# Patient Record
Sex: Female | Born: 2000 | Race: White | Hispanic: No | Marital: Single
Health system: Midwestern US, Community
[De-identification: ages and names within clinical notes are randomized; demographics above are authoritative.]

## PROBLEM LIST (undated history)

## (undated) DIAGNOSIS — F429 Obsessive-compulsive disorder, unspecified: Secondary | ICD-10-CM

## (undated) DIAGNOSIS — R51 Headache: Secondary | ICD-10-CM

## (undated) HISTORY — DX: Headache: R51

---
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
## 2020-09-07 ENCOUNTER — Inpatient Hospital Stay
Admit: 2020-09-07 | Discharge: 2020-09-07 | Disposition: A | Discharge status: Home / Self Care | Payer: PRIVATE HEALTH INSURANCE | Attending: Emergency Medicine

## 2020-09-07 DIAGNOSIS — N390 Urinary tract infection, site not specified: Secondary | ICD-10-CM

## 2020-09-07 LAB — HCG URINE, QL. - POC
HCG, Pregnancy, Urine, POC: NEGATIVE
Pregnancy test,urine (POC): NEGATIVE

## 2020-09-07 LAB — URINE MICROSCOPIC
Casts UA: 0 /lpf
Casts: 0 /lpf
Crystals, UA: 0 /LPF
Crystals, urine: 0 /LPF
Epithelial Cells, UA: 0 /hpf
Epithelial cells: 0 /hpf
MUCUS, URINE: 0 /lpf
Mucus: 0 /lpf

## 2020-09-07 MED ORDER — CEPHALEXIN 500 MG CAP
500 mg | ORAL | Status: AC
Start: 2020-09-07 — End: 2020-09-07
  Administered 2020-09-07: 06:00:00 via ORAL

## 2020-09-07 MED ORDER — PHENAZOPYRIDINE 95 MG TABLET
95 mg | ORAL | Status: AC
Start: 2020-09-07 — End: 2020-09-07
  Administered 2020-09-07: 06:00:00 via ORAL

## 2020-09-07 MED ORDER — CEPHALEXIN 500 MG CAP
500 mg | ORAL_CAPSULE | Freq: Three times a day (TID) | ORAL | 0 refills | Status: AC
Start: 2020-09-07 — End: 2020-09-12

## 2020-09-07 MED ORDER — PHENAZOPYRIDINE 200 MG TAB
200 mg | ORAL_TABLET | Freq: Three times a day (TID) | ORAL | 0 refills | Status: AC
Start: 2020-09-07 — End: 2020-09-09

## 2020-09-07 MED FILL — CEPHALEXIN 500 MG CAP: 500 mg | ORAL | Qty: 1

## 2020-09-07 MED FILL — PHENAZOPYRIDINE 95 MG TABLET: 95 mg | ORAL | Qty: 2

## 2020-09-07 NOTE — ED Notes (Signed)
Pt arrives via POV coming from home c/o UTI like symptoms and back pain that  started at 9pm. Pt states her urine has been orange colored and has had this issue before. Pt states she was planning to go to urgent care in the morning but her back pain has not let her sleep. No hx of kidney stones. No other complaints at time of triage.

## 2020-09-07 NOTE — ED Notes (Signed)

## 2020-09-07 NOTE — ED Provider Notes (Signed)
The history is provided by the patient.   Bladder Infection   This is a new problem. The current episode started 3 to 5 hours ago. The problem occurs every urination. The problem has not changed since onset.The quality of the pain is described as burning and aching. The pain is at a severity of 3/10. There has been no fever. She is sexually active. There is no history of pyelonephritis. Associated symptoms include frequency. Pertinent negatives include no chills, no sweats, no nausea, no vomiting, no discharge, no hematuria, no hesitancy, no possible pregnancy, no urgency and no flank pain. She has tried nothing for the symptoms. Her past medical history is significant for recurrent UTIs. Her past medical history does not include kidney stones, single kidney, urological procedure, urinary stasis or catheterization.        No past medical history on file.    No past surgical history on file.      No family history on file.    Social History     Socioeconomic History   ??? Marital status: SINGLE     Spouse name: Not on file   ??? Number of children: Not on file   ??? Years of education: Not on file   ??? Highest education level: Not on file   Occupational History   ??? Not on file   Tobacco Use   ??? Smoking status: Not on file   Substance and Sexual Activity   ??? Alcohol use: Not on file   ??? Drug use: Not on file   ??? Sexual activity: Not on file   Other Topics Concern   ??? Not on file   Social History Narrative   ??? Not on file     Social Determinants of Health     Financial Resource Strain:    ??? Difficulty of Paying Living Expenses:    Food Insecurity:    ??? Worried About Programme researcher, broadcasting/film/video in the Last Year:    ??? Barista in the Last Year:    Transportation Needs:    ??? Freight forwarder (Medical):    ??? Lack of Transportation (Non-Medical):    Physical Activity:    ??? Days of Exercise per Week:    ??? Minutes of Exercise per Session:    Stress:    ??? Feeling of Stress :    Social Connections:    ??? Frequency of Communication  with Friends and Family:    ??? Frequency of Social Gatherings with Friends and Family:    ??? Attends Religious Services:    ??? Database administrator or Organizations:    ??? Attends Engineer, structural:    ??? Marital Status:    Intimate Programme researcher, broadcasting/film/video Violence:    ??? Fear of Current or Ex-Partner:    ??? Emotionally Abused:    ??? Physically Abused:    ??? Sexually Abused:          ALLERGIES: Patient has no known allergies.    Review of Systems   Constitutional: Negative for chills and fever.   HENT: Negative for congestion.    Gastrointestinal: Negative for abdominal pain, nausea and vomiting.   Genitourinary: Positive for dysuria and frequency. Negative for flank pain, hematuria, hesitancy, urgency, vaginal bleeding and vaginal discharge.   Musculoskeletal: Positive for back pain.   All other systems reviewed and are negative.      Vitals:    09/07/20 0028   BP: 131/84   Pulse: 79  Resp: 16   Temp: 99.1 ??F (37.3 ??C)   SpO2: 100%   Weight: 68 kg (150 lb)   Height: 5\' 10"  (1.778 m)            Physical Exam  Vitals and nursing note reviewed.   Constitutional:       General: She is not in acute distress.     Appearance: She is well-developed.   HENT:      Head: Normocephalic and atraumatic.      Right Ear: External ear normal.      Left Ear: External ear normal.   Eyes:      Conjunctiva/sclera: Conjunctivae normal.      Pupils: Pupils are equal, round, and reactive to light.   Cardiovascular:      Rate and Rhythm: Normal rate and regular rhythm.      Heart sounds: Normal heart sounds. No murmur heard.     Pulmonary:      Effort: Pulmonary effort is normal.      Breath sounds: Normal breath sounds.   Abdominal:      General: Bowel sounds are normal.      Palpations: Abdomen is soft.      Tenderness: There is no abdominal tenderness. There is no right CVA tenderness or left CVA tenderness.   Musculoskeletal:         General: Normal range of motion.      Cervical back: Normal range of motion and neck supple.   Skin:     General:  Skin is warm and dry.      Capillary Refill: Capillary refill takes less than 2 seconds.   Neurological:      General: No focal deficit present.      Mental Status: She is alert and oriented to person, place, and time.   Psychiatric:         Mood and Affect: Mood normal.         Behavior: Behavior normal.          MDM  Number of Diagnoses or Management Options  Urinary tract infection without hematuria, site unspecified: new and requires workup     Amount and/or Complexity of Data Reviewed  Clinical lab tests: ordered and reviewed  Review and summarize past medical records: yes    Risk of Complications, Morbidity, and/or Mortality  Presenting problems: low  Diagnostic procedures: minimal  Management options: low    Patient Progress  Patient progress: improved         Procedures      The patient was observed in the ED. patient feeling much improved at time of discharge.  With the acute onset of discomfort starting at 9 PM, concern for possible kidney stone exists.  At this time we will treat the patient for urinary tract infection, instructed to take Motrin as needed for pain, and Pyridium as needed for dysuria.    Results Reviewed:      Recent Results (from the past 24 hour(s))   HCG URINE, QL. - POC    Collection Time: 09/07/20 12:55 AM   Result Value Ref Range    Pregnancy test,urine (POC) Negative NEG     URINE MICROSCOPIC    Collection Time: 09/07/20 12:58 AM   Result Value Ref Range    WBC 5-10 0 /hpf    RBC 3-5 0 /hpf    Epithelial cells 0 0 /hpf    Bacteria TRACE 0 /hpf    Casts 0 0 /lpf  Crystals, urine 0 0 /LPF    Mucus 0 0 /lpf    Other observations RESULTS VERIFIED MANUALLY         I discussed the results of all labs, procedures, radiographs, and treatments with the patient and available family.  Treatment plan is agreed upon and the patient is ready for discharge.  All voiced understanding of the discharge plan and medication instructions or changes as appropriate.  Questions about treatment in the ED  were answered.  All were encouraged to return should symptoms worsen or new problems develop.

## 2020-09-12 LAB — CHLAMYDIA / GC-AMPLIFIED
CHLAMYDIA TRACHOMATIS, NAA, 188078: NEGATIVE
Chlamydia trachomatis, NAA: NEGATIVE
NEISSERIA GONORRHOEAE, NAA, 188086: NEGATIVE
Neisseria gonorrhoeae, NAA: NEGATIVE

## 2020-10-09 DIAGNOSIS — Z3041 Encounter for surveillance of contraceptive pills: Secondary | ICD-10-CM | POA: Diagnosis not present

## 2020-10-09 DIAGNOSIS — F5002 Anorexia nervosa, binge eating/purging type: Secondary | ICD-10-CM | POA: Diagnosis not present

## 2020-10-09 DIAGNOSIS — F411 Generalized anxiety disorder: Secondary | ICD-10-CM | POA: Diagnosis not present

## 2020-10-09 DIAGNOSIS — F429 Obsessive-compulsive disorder, unspecified: Secondary | ICD-10-CM | POA: Diagnosis not present

## 2020-10-30 DIAGNOSIS — L700 Acne vulgaris: Secondary | ICD-10-CM | POA: Diagnosis not present

## 2020-10-30 DIAGNOSIS — D229 Melanocytic nevi, unspecified: Secondary | ICD-10-CM | POA: Diagnosis not present

## 2020-10-30 DIAGNOSIS — D1801 Hemangioma of skin and subcutaneous tissue: Secondary | ICD-10-CM | POA: Diagnosis not present

## 2020-11-12 DIAGNOSIS — Z0000 Encounter for general adult medical examination without abnormal findings: Secondary | ICD-10-CM | POA: Diagnosis not present

## 2021-03-08 ENCOUNTER — Other Ambulatory Visit (HOSPITAL_BASED_OUTPATIENT_CLINIC_OR_DEPARTMENT_OTHER): Payer: Self-pay

## 2021-03-08 ENCOUNTER — Other Ambulatory Visit: Payer: Self-pay

## 2021-03-08 ENCOUNTER — Ambulatory Visit: Payer: Self-pay | Attending: Internal Medicine

## 2021-03-08 DIAGNOSIS — Z23 Encounter for immunization: Secondary | ICD-10-CM

## 2021-03-08 MED ORDER — PFIZER-BIONT COVID-19 VAC-TRIS 30 MCG/0.3ML IM SUSP
INTRAMUSCULAR | 0 refills | Status: AC
  Filled 2021-03-08: qty 0.3, 1d supply, fill #0

## 2021-05-02 ENCOUNTER — Other Ambulatory Visit: Payer: Self-pay | Admitting: Obstetrics and Gynecology

## 2021-05-02 DIAGNOSIS — N6310 Unspecified lump in the right breast, unspecified quadrant: Secondary | ICD-10-CM

## 2021-05-17 ENCOUNTER — Other Ambulatory Visit: Payer: Self-pay | Admitting: Obstetrics and Gynecology

## 2021-05-17 ENCOUNTER — Ambulatory Visit
Admission: RE | Admit: 2021-05-17 | Discharge: 2021-05-17 | Disposition: A | Discharge status: Home / Self Care | Payer: 59 | Source: Ambulatory Visit | Attending: Obstetrics and Gynecology | Admitting: Obstetrics and Gynecology

## 2021-05-17 ENCOUNTER — Other Ambulatory Visit: Payer: Self-pay

## 2021-05-17 DIAGNOSIS — N6310 Unspecified lump in the right breast, unspecified quadrant: Secondary | ICD-10-CM

## 2021-07-25 ENCOUNTER — Other Ambulatory Visit: Payer: Self-pay | Admitting: General Surgery

## 2021-07-25 DIAGNOSIS — Z09 Encounter for follow-up examination after completed treatment for conditions other than malignant neoplasm: Secondary | ICD-10-CM

## 2021-10-28 ENCOUNTER — Ambulatory Visit
Admission: RE | Admit: 2021-10-28 | Discharge: 2021-10-28 | Disposition: A | Discharge status: Home / Self Care | Payer: BLUE CROSS/BLUE SHIELD | Source: Ambulatory Visit | Attending: General Surgery | Admitting: General Surgery

## 2021-10-28 DIAGNOSIS — N6311 Unspecified lump in the right breast, upper outer quadrant: Secondary | ICD-10-CM | POA: Diagnosis not present

## 2021-10-28 DIAGNOSIS — Z09 Encounter for follow-up examination after completed treatment for conditions other than malignant neoplasm: Secondary | ICD-10-CM

## 2021-12-23 DIAGNOSIS — G4700 Insomnia, unspecified: Secondary | ICD-10-CM | POA: Diagnosis not present

## 2021-12-23 DIAGNOSIS — F411 Generalized anxiety disorder: Secondary | ICD-10-CM | POA: Diagnosis not present

## 2021-12-23 DIAGNOSIS — F429 Obsessive-compulsive disorder, unspecified: Secondary | ICD-10-CM | POA: Diagnosis not present

## 2022-02-23 IMAGING — US US  BREAST BX W/ LOC DEV 1ST LESION IMG BX SPEC US GUIDE*R*
1 series · 7 of 7 positions shown · non-contrast
Comparison: Previous exam(s).
COMPARISON: Previous exam(s).

Addendum:
CLINICAL DATA: 20-year-old female with an indeterminate palpable
right breast lump.

EXAM:
ULTRASOUND GUIDED RIGHT BREAST CORE NEEDLE BIOPSY

[Series 1: us breast bx w/ loc dev 1st lesion img bx spec us  · 0.06mm/px · 7 of 7 slices shown]
[im 1/7]
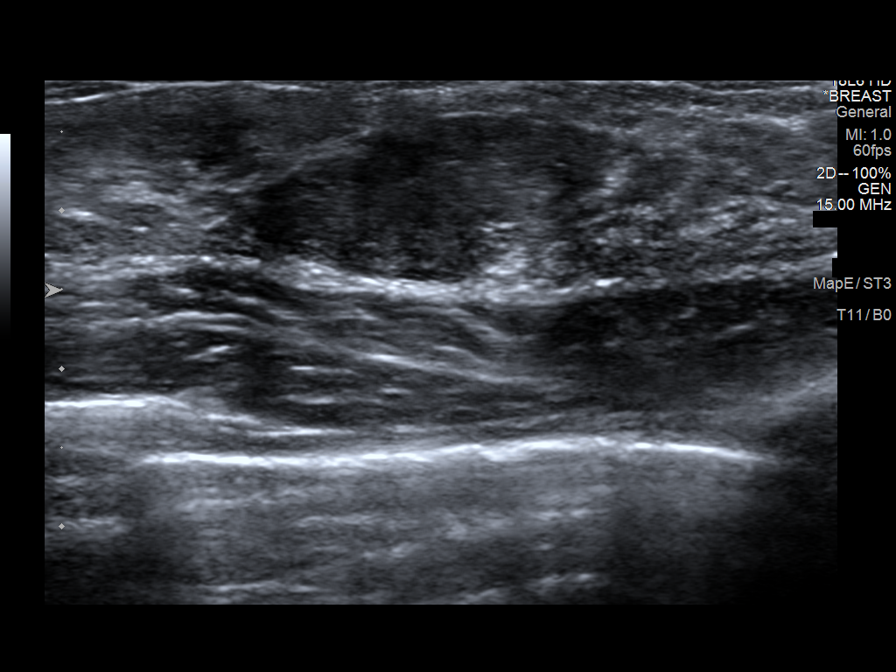
[im 2/7]
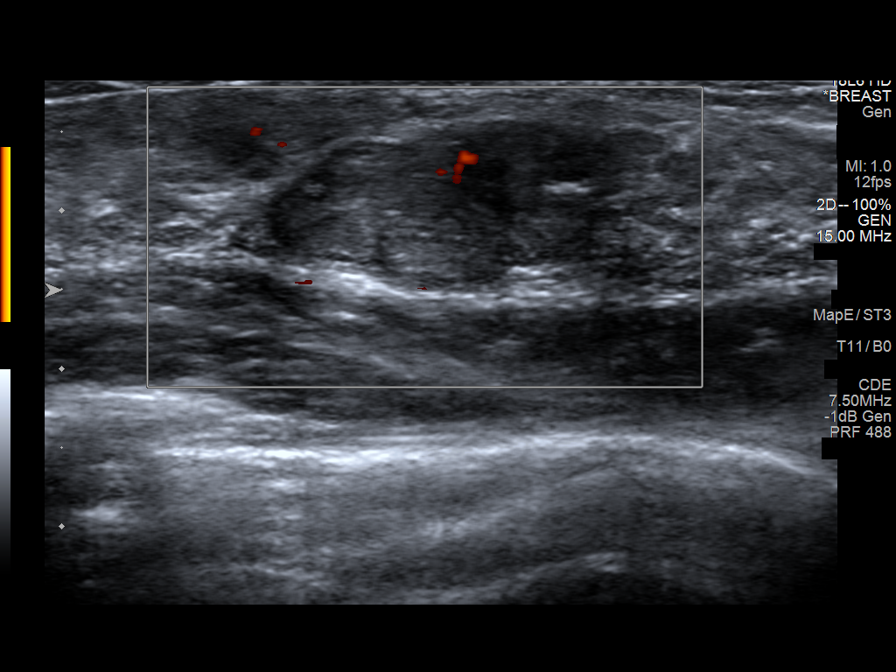
[im 3/7]
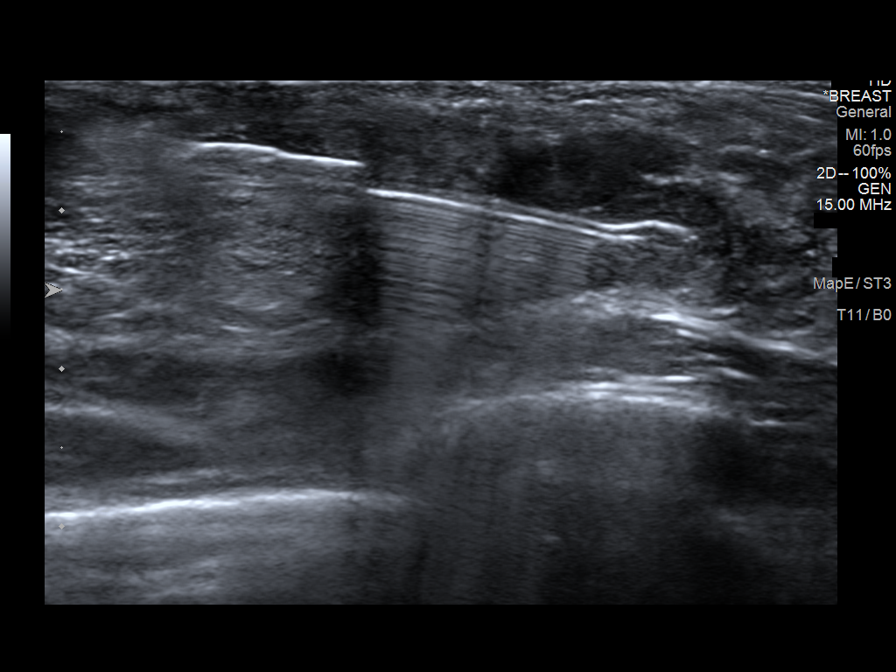
[im 4/7]
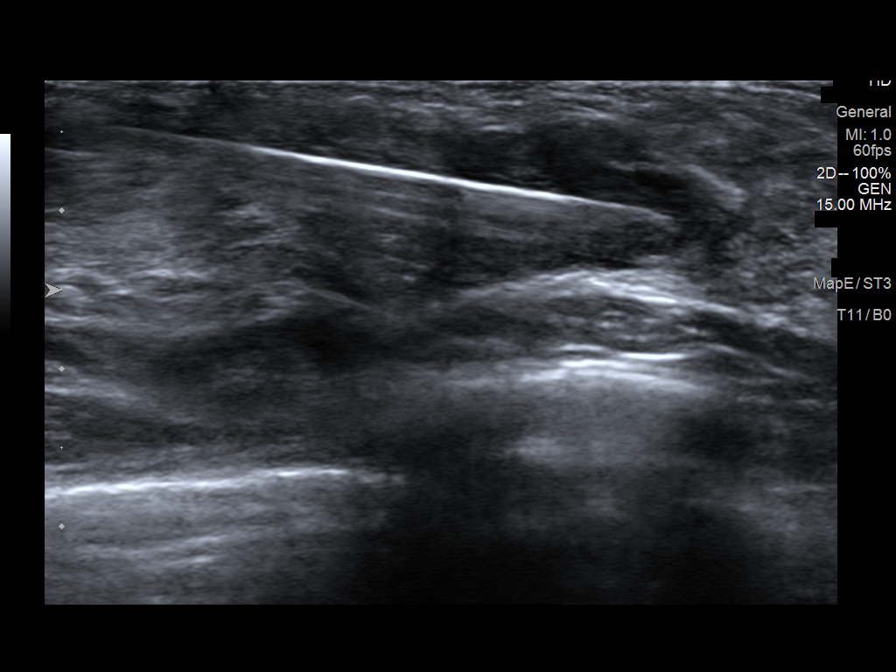
[im 5/7]
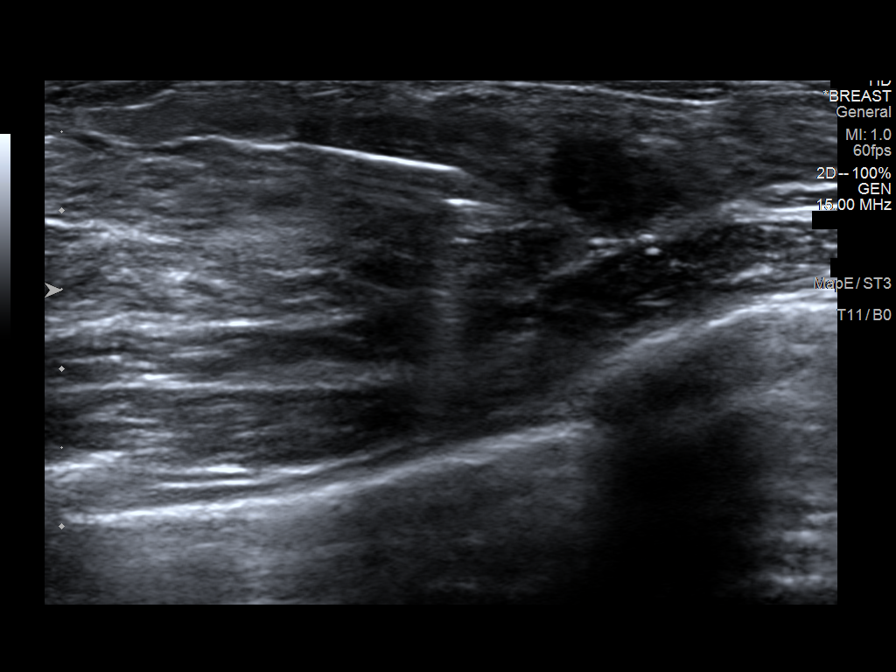
[im 6/7]
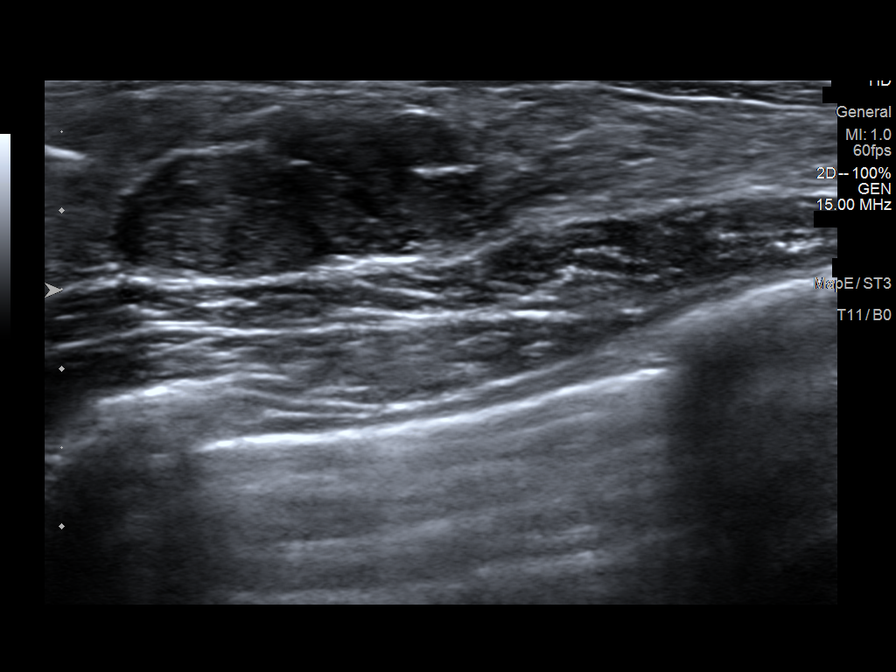
[im 7/7]
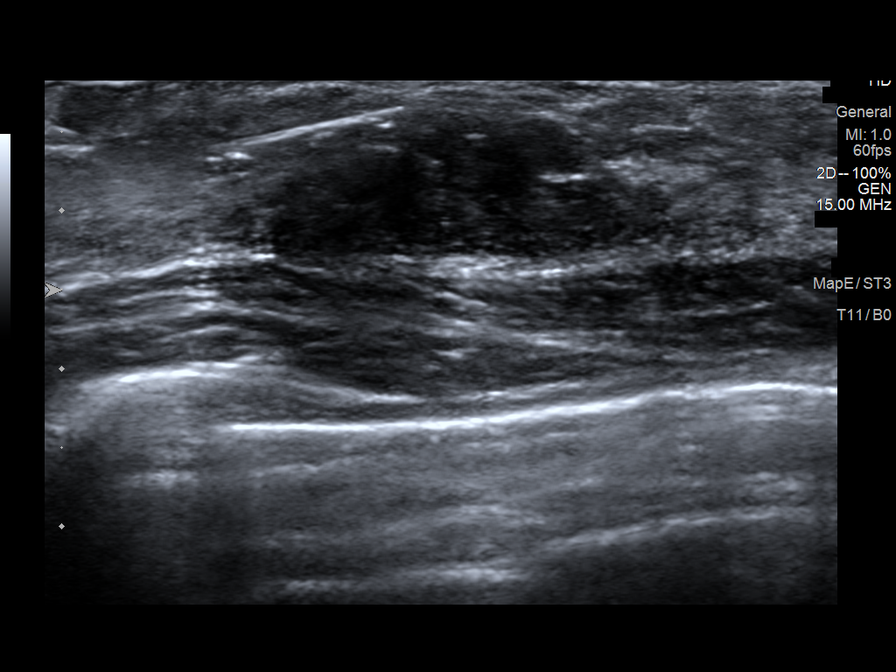

[7 of 7 positions shown; findings below may reference images not displayed]



Lesion quadrant: Upper outer quadrant

Using sterile technique and 1% Lidocaine as local anesthetic, under
direct ultrasound visualization, a 14 gauge Jim device was
used to perform biopsy of a palpable lump at the 11 o'clock position
using a lateral approach. At the conclusion of the procedure a
tribell tissue marker clip was deployed into the biopsy cavity.
Follow up 2 view mammogram was deferred at this time.
IMPRESSION: Ultrasound guided biopsy of the right breast. No apparent
complications.

ADDENDUM:
Pathology revealed FIBROADENOMA of the Right breast, 11:00 o'clock 4
cmfn. This was found to be concordant by Dr. Ingel Otey.

Pathology results were discussed with the patient's mother, Alino
Pepe, by telephone. The patient's mother reported her daughter
did well after the biopsy with tenderness at the site. Post biopsy
instructions and care were reviewed and questions were answered. The
patient's mother was encouraged to call The [REDACTED] of
number was provided.

The patient's mother requested a surgical referral with Dr. Antonio
Tiger at [REDACTED] to discuss the biopsy
findings.

Request for appointment sent to [REDACTED] via [REDACTED]
message on May 20, 2021. The patient will be contacted with the
details regarding this appointment.

Pathology results reported by Peterson Morel, RN on 05/21/2021.



Lesion quadrant: Upper outer quadrant

Using sterile technique and 1% Lidocaine as local anesthetic, under
direct ultrasound visualization, a 14 gauge Jim device was
used to perform biopsy of a palpable lump at the 11 o'clock position
using a lateral approach. At the conclusion of the procedure a
tribell tissue marker clip was deployed into the biopsy cavity.
Follow up 2 view mammogram was deferred at this time.
IMPRESSION: Ultrasound guided biopsy of the right breast. No apparent
complications.

## 2022-02-23 IMAGING — US US BREAST*R* LIMITED INC AXILLA
1 series · 7 of 7 positions shown · non-contrast
Comparison: None.

CLINICAL DATA: 20-year-old female complaining of a palpable
abnormality in the 11 o'clock region of the right breast. Patient's
mother has a history of premenopausal breast cancer.

EXAM:
ULTRASOUND OF THE RIGHT BREAST

[Series 1: us breast*right* limited inc axilla · 0.05mm/px · 7 of 7 slices shown]
[im 1/7]
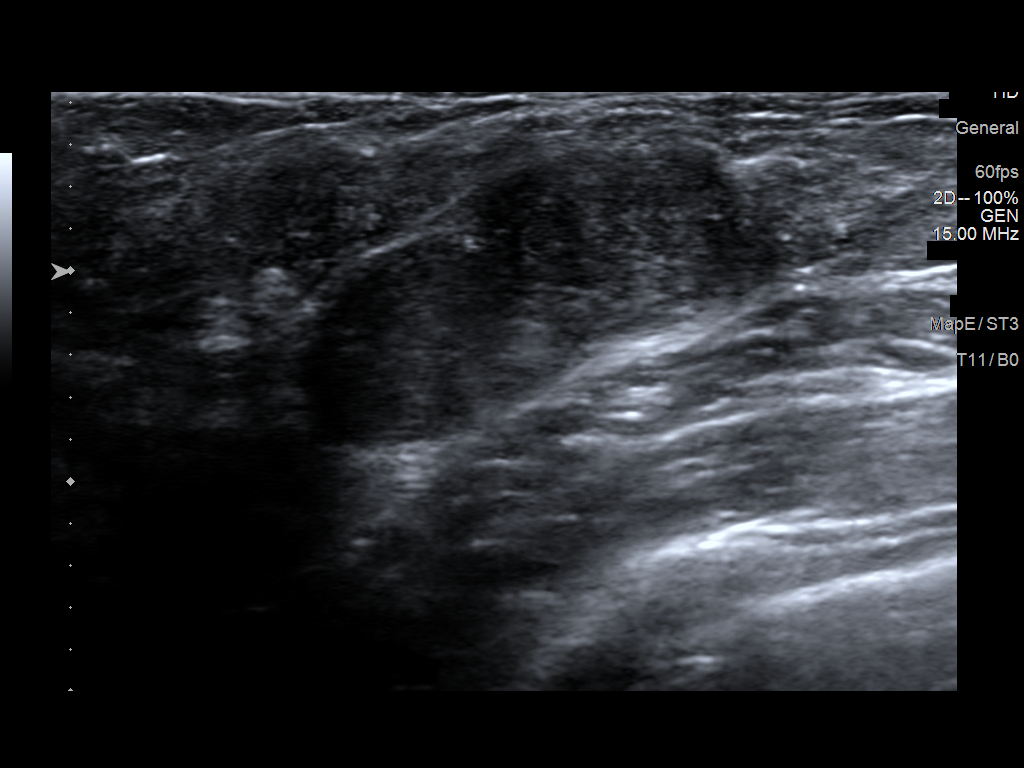
[im 2/7]
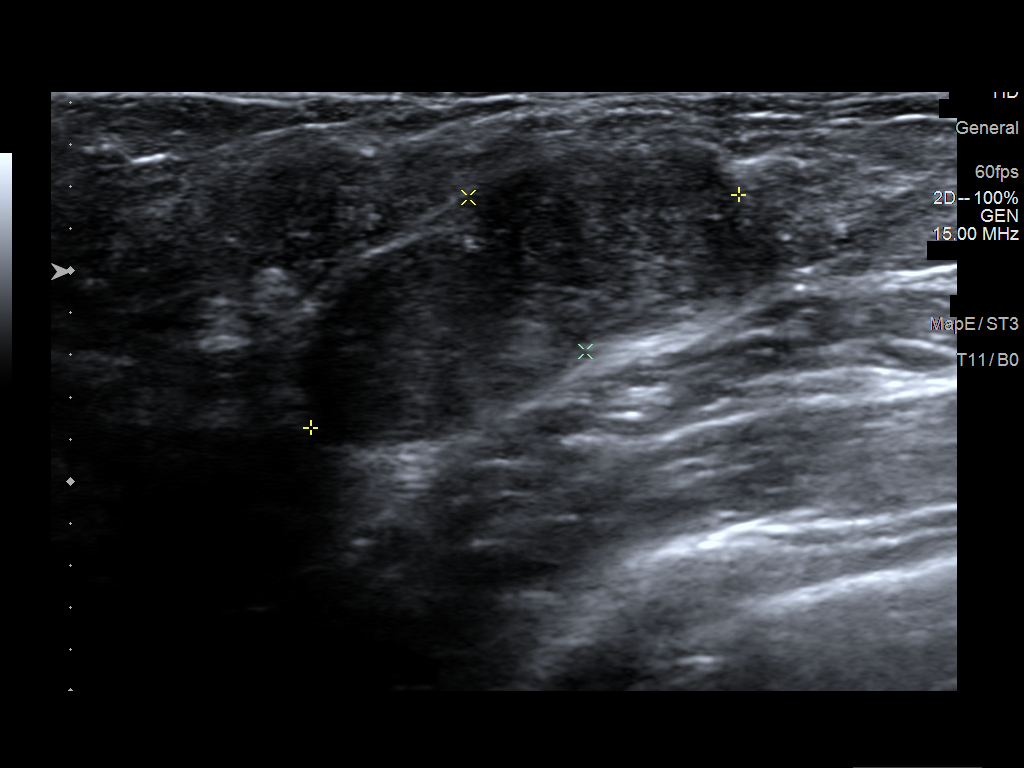
[im 3/7]
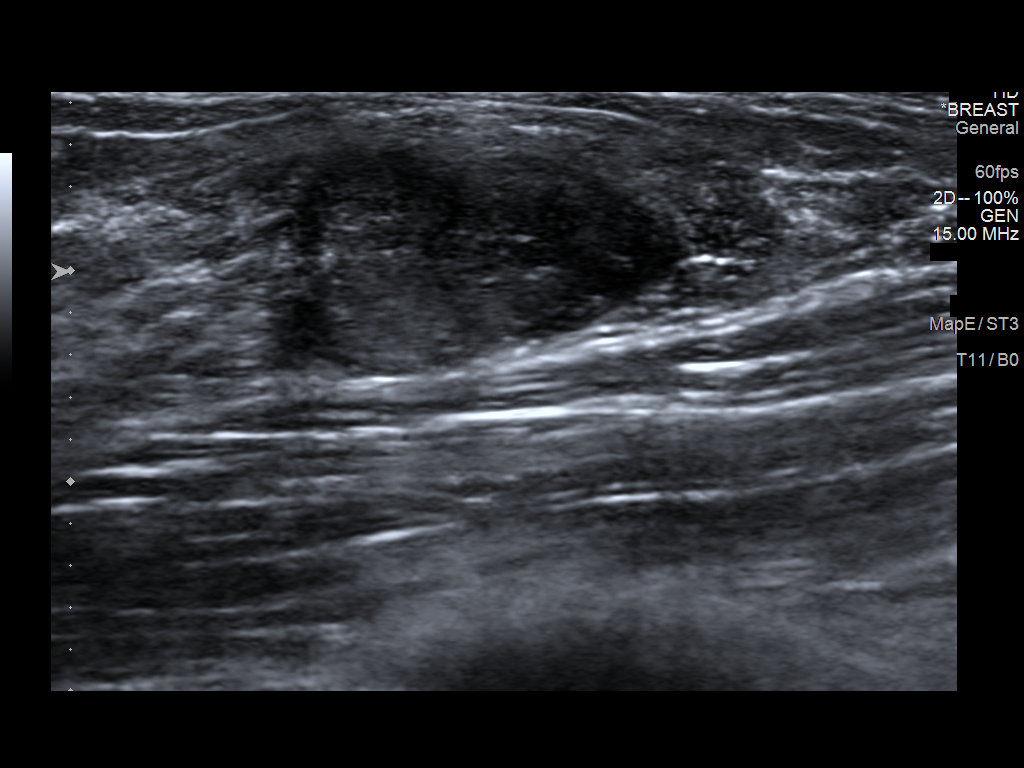
[im 4/7]
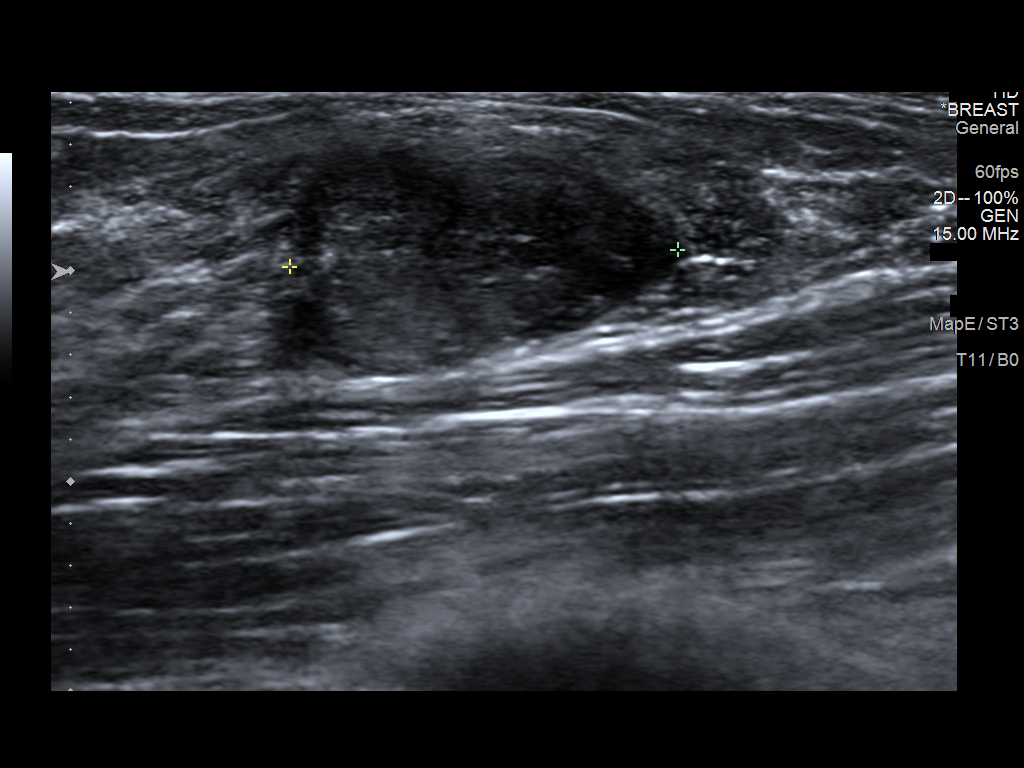
[im 5/7]
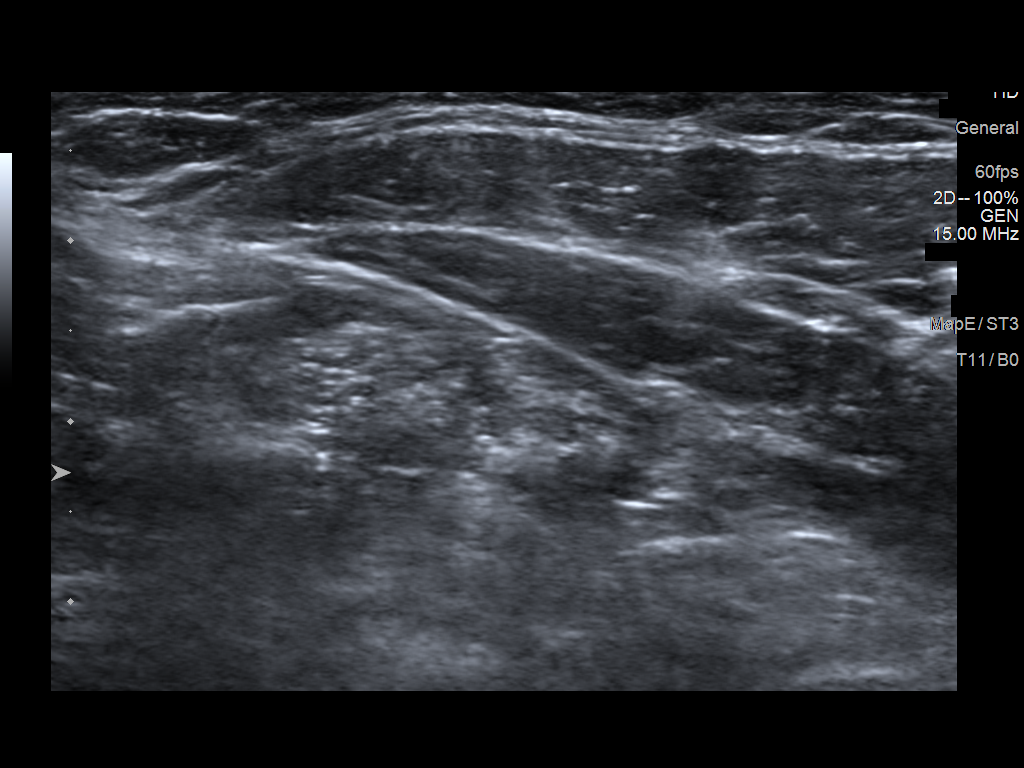
[im 6/7]
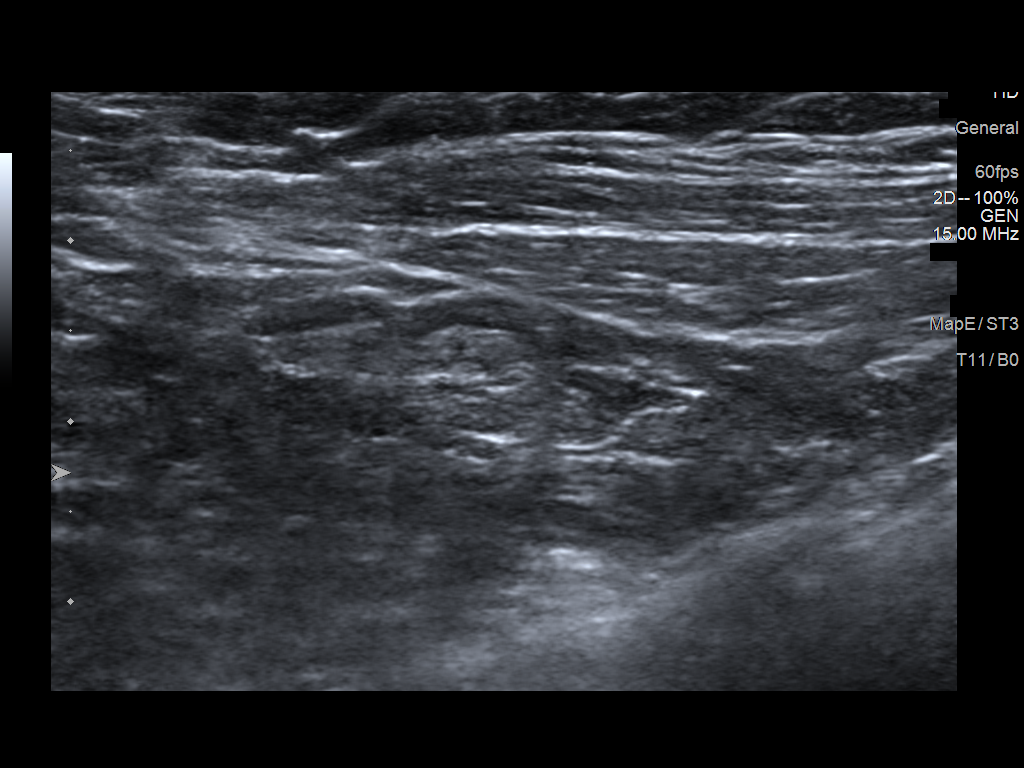
[im 7/7]
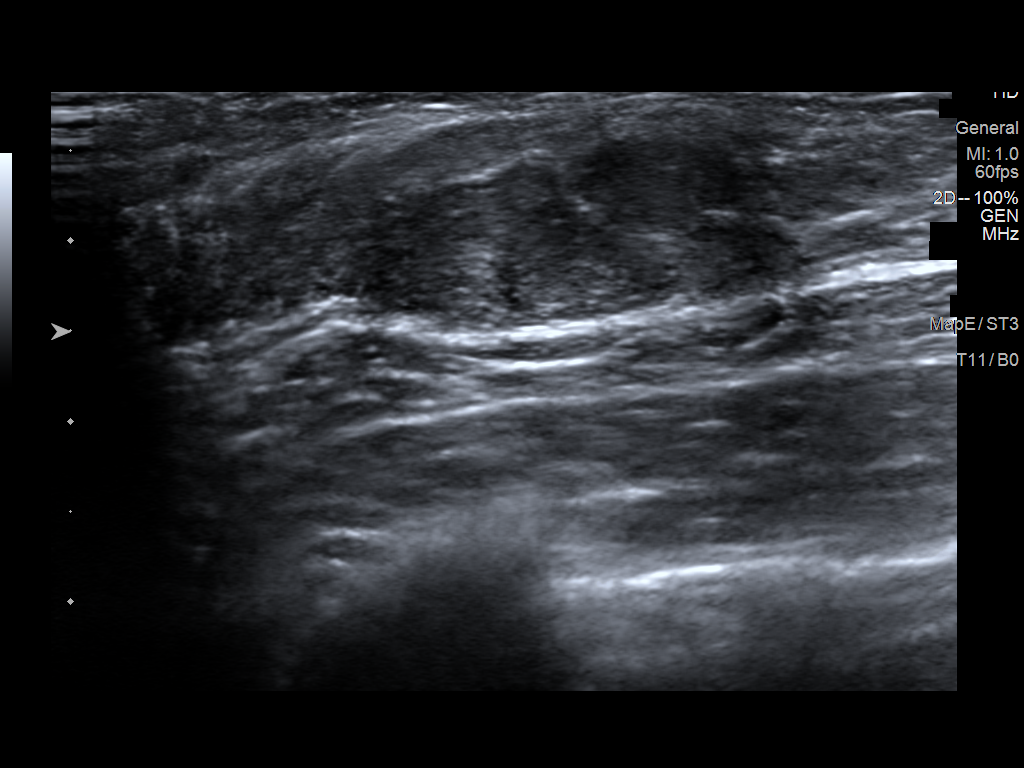

[7 of 7 positions shown; findings below may reference images not displayed]

FINDINGS: On physical exam, I palpate a discrete freely mobile mass in the
right breast 11 o'clock 4 cm from the nipple.

Targeted ultrasound is performed, showing a well-circumscribed
bilobed hypoechoic mass in the right breast 11 o'clock 4 cm from the
nipple measuring 2.3 x 0.9 x 1.8 cm. Sonographic evaluation the
right axilla does not show any enlarged adenopathy.
IMPRESSION: Indeterminate, new palpable right breast mass.

RECOMMENDATION:
Ultrasound-guided core biopsy of mass in the 11 o'clock region of
the right breast is recommended.

I have discussed the findings and recommendations with the patient.
If applicable, a reminder letter will be sent to the patient
regarding the next appointment.

BI-RADS CATEGORY  4: Suspicious.

## 2022-06-17 DIAGNOSIS — Z01419 Encounter for gynecological examination (general) (routine) without abnormal findings: Secondary | ICD-10-CM | POA: Diagnosis not present

## 2022-06-17 DIAGNOSIS — H5201 Hypermetropia, right eye: Secondary | ICD-10-CM | POA: Diagnosis not present

## 2022-06-17 DIAGNOSIS — Z130 Encounter for screening for diseases of the blood and blood-forming organs and certain disorders involving the immune mechanism: Secondary | ICD-10-CM | POA: Diagnosis not present

## 2022-08-06 IMAGING — US US BREAST*R* LIMITED INC AXILLA
1 series · 4 of 4 positions shown · non-contrast
Comparison: 05/17/2021.

CLINICAL DATA: Follow-up for a right breast mass, which presented
as a palpable lump in the 11 o'clock position of the right breast.
This was biopsied with pathology revealing a fibroadenoma,
benign/concordant. Due to a family history breast carcinoma, the
patient saw Dr. Pingo for possible excision. Short-term
follow-up was recommended as an alternative.

EXAM:
ULTRASOUND OF THE RIGHT BREAST

[Series 1: us breast*right* limited inc axilla · 0.06mm/px · 4 of 4 slices shown]
[im 1/4]
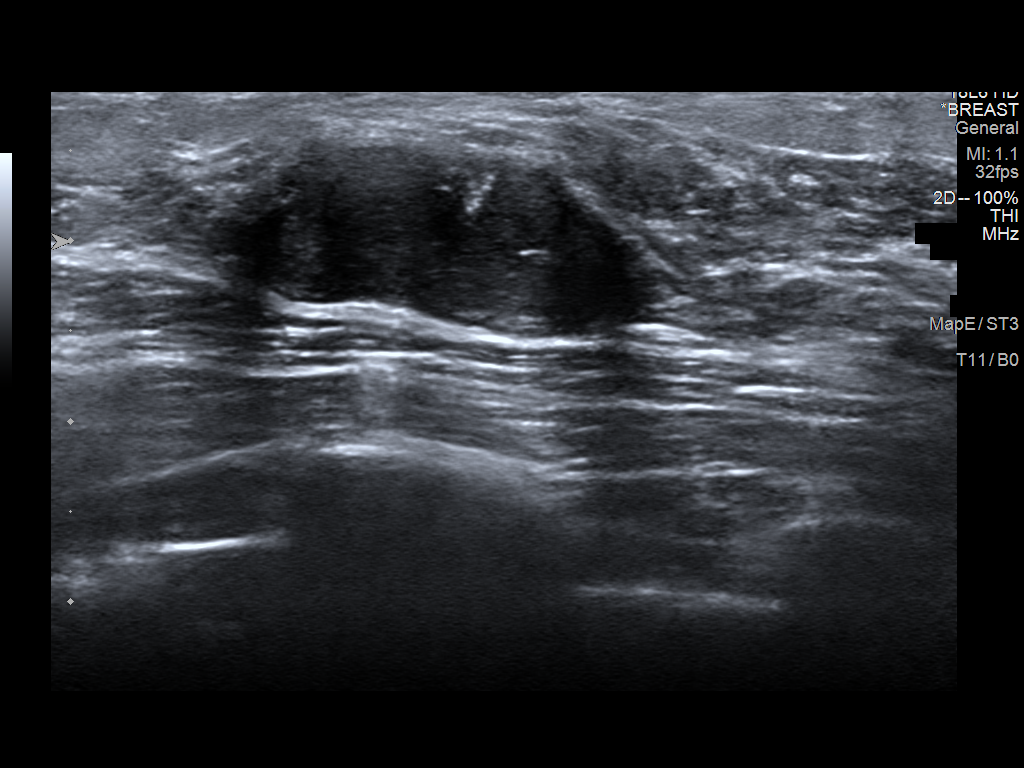
[im 2/4]
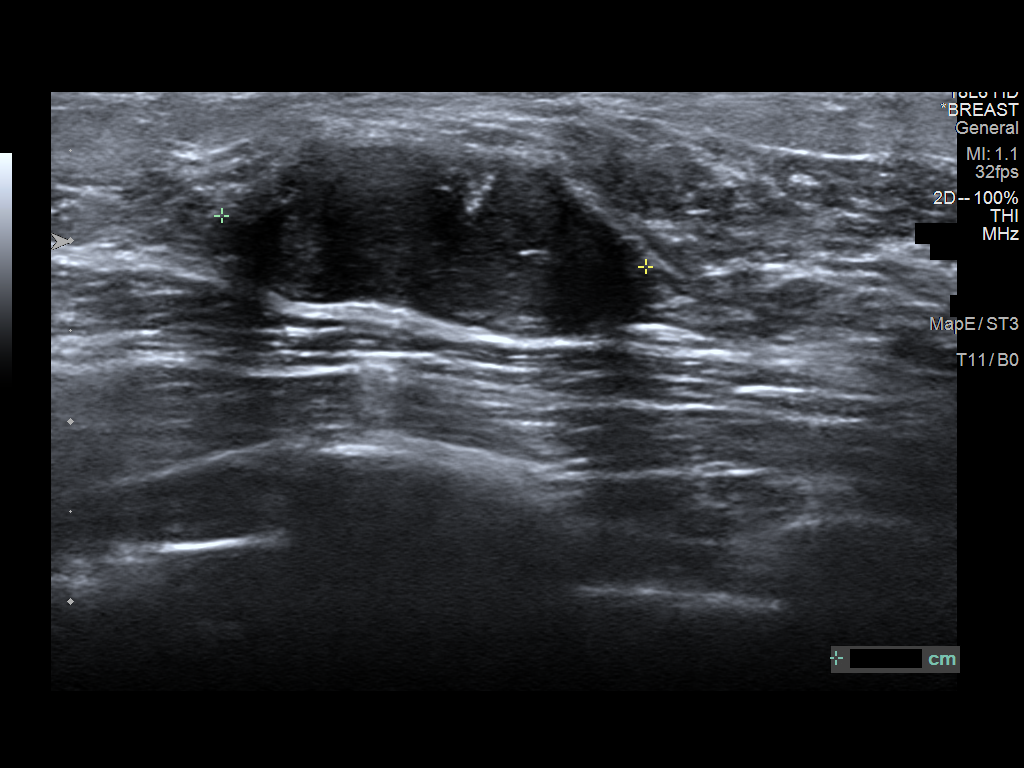
[im 3/4]
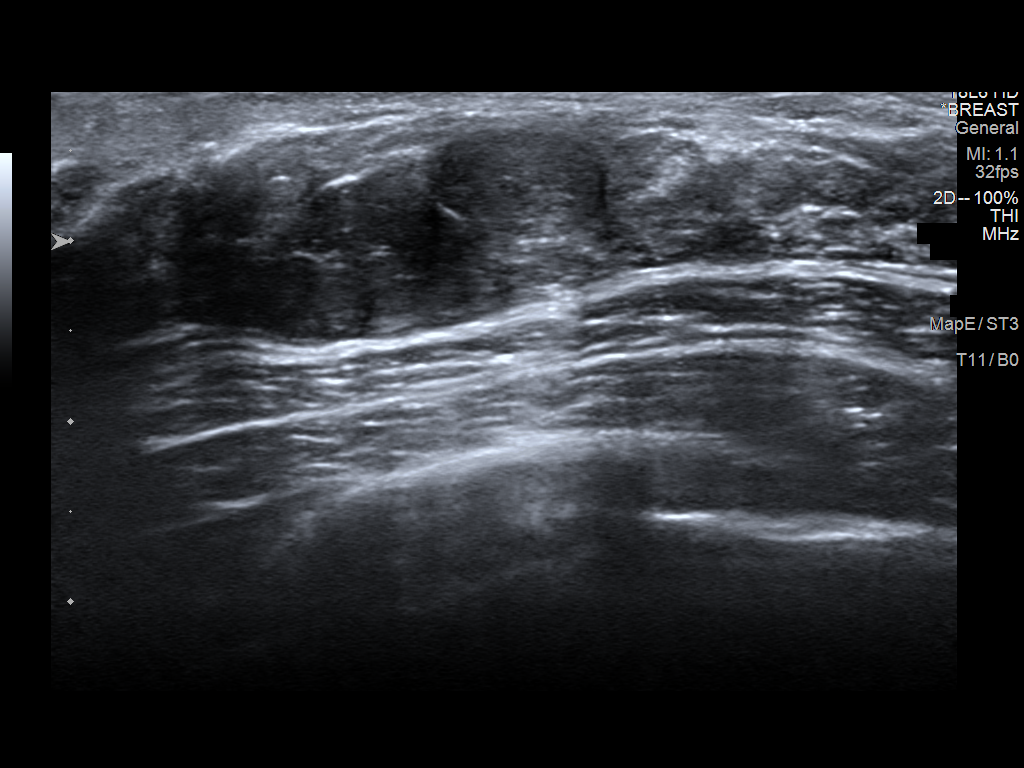
[im 4/4]
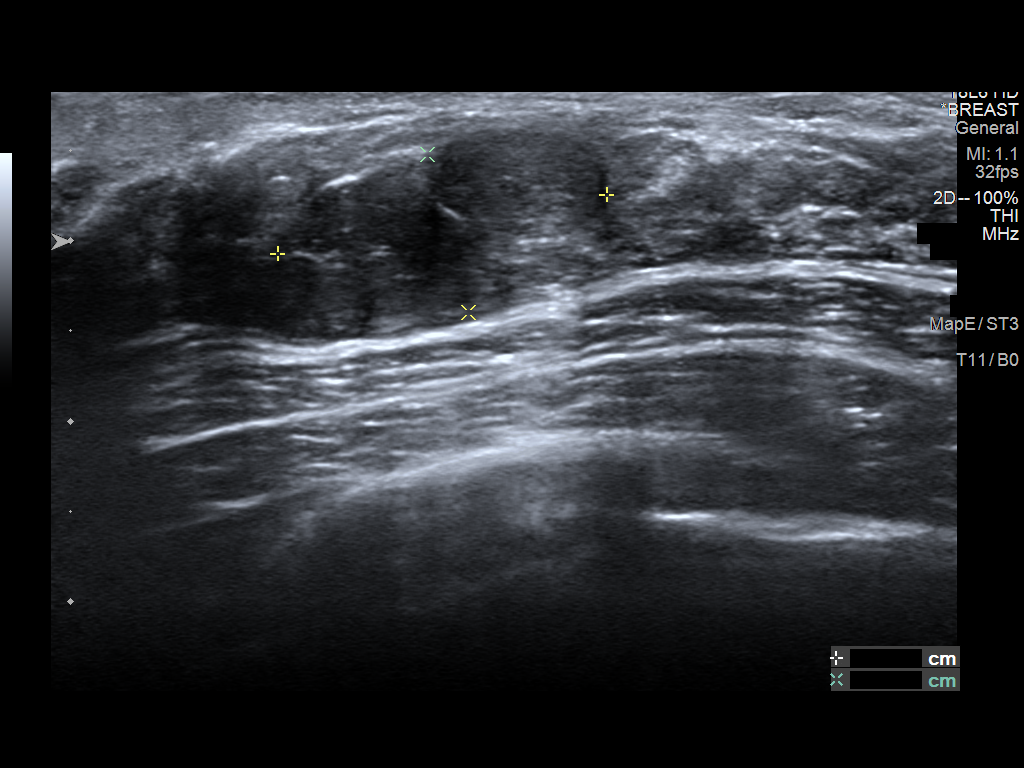

[4 of 4 positions shown; findings below may reference images not displayed]

FINDINGS: Targeted right breast ultrasound is performed, showing hypoechoic
oval circumscribed mass, oriented parallel, at 11 o'clock, 4 cm the
nipple, measuring 2.4 x 0.9 x 1.9 cm, previously 2.3 x 0.9 x 1.8 cm,
without significant change.
IMPRESSION: 1. Biopsy-proven benign right breast fibroadenoma. No significant
change since May 2021.

RECOMMENDATION:
1. Screening mammogram at age 38 unless there are persistent or
intervening clinical concerns. (Code:[id number])
2. Patient destructive to return for repeat ultrasound if the
palpable mass appears to be enlarged.

I have discussed the findings and recommendations with the patient.
If applicable, a reminder letter will be sent to the patient
regarding the next appointment.

BI-RADS CATEGORY  2: Benign.

## 2022-10-27 DIAGNOSIS — Z13220 Encounter for screening for lipoid disorders: Secondary | ICD-10-CM | POA: Diagnosis not present

## 2022-10-27 DIAGNOSIS — Z0000 Encounter for general adult medical examination without abnormal findings: Secondary | ICD-10-CM | POA: Diagnosis not present

## 2022-10-27 DIAGNOSIS — Z114 Encounter for screening for human immunodeficiency virus [HIV]: Secondary | ICD-10-CM | POA: Diagnosis not present

## 2022-10-29 DIAGNOSIS — G4700 Insomnia, unspecified: Secondary | ICD-10-CM | POA: Diagnosis not present

## 2022-10-29 DIAGNOSIS — H6123 Impacted cerumen, bilateral: Secondary | ICD-10-CM | POA: Diagnosis not present

## 2022-10-29 DIAGNOSIS — F331 Major depressive disorder, recurrent, moderate: Secondary | ICD-10-CM | POA: Diagnosis not present

## 2022-10-29 DIAGNOSIS — Z0000 Encounter for general adult medical examination without abnormal findings: Secondary | ICD-10-CM | POA: Diagnosis not present

## 2022-10-29 DIAGNOSIS — F411 Generalized anxiety disorder: Secondary | ICD-10-CM | POA: Diagnosis not present

## 2022-10-29 DIAGNOSIS — F429 Obsessive-compulsive disorder, unspecified: Secondary | ICD-10-CM | POA: Diagnosis not present

## 2022-11-05 DIAGNOSIS — H6123 Impacted cerumen, bilateral: Secondary | ICD-10-CM | POA: Diagnosis not present

## 2022-12-10 DIAGNOSIS — F331 Major depressive disorder, recurrent, moderate: Secondary | ICD-10-CM | POA: Diagnosis not present

## 2022-12-10 DIAGNOSIS — F411 Generalized anxiety disorder: Secondary | ICD-10-CM | POA: Diagnosis not present

## 2022-12-10 DIAGNOSIS — G4700 Insomnia, unspecified: Secondary | ICD-10-CM | POA: Diagnosis not present

## 2022-12-10 DIAGNOSIS — F429 Obsessive-compulsive disorder, unspecified: Secondary | ICD-10-CM | POA: Diagnosis not present

## 2023-03-17 DIAGNOSIS — Z01419 Encounter for gynecological examination (general) (routine) without abnormal findings: Secondary | ICD-10-CM | POA: Diagnosis not present

## 2023-03-17 DIAGNOSIS — Z113 Encounter for screening for infections with a predominantly sexual mode of transmission: Secondary | ICD-10-CM | POA: Diagnosis not present

## 2023-03-17 DIAGNOSIS — L700 Acne vulgaris: Secondary | ICD-10-CM | POA: Diagnosis not present

## 2023-03-17 DIAGNOSIS — Z130 Encounter for screening for diseases of the blood and blood-forming organs and certain disorders involving the immune mechanism: Secondary | ICD-10-CM | POA: Diagnosis not present

## 2023-03-17 DIAGNOSIS — Z1389 Encounter for screening for other disorder: Secondary | ICD-10-CM | POA: Diagnosis not present

## 2023-06-22 DIAGNOSIS — L700 Acne vulgaris: Secondary | ICD-10-CM | POA: Diagnosis not present

## 2023-10-21 DIAGNOSIS — R55 Syncope and collapse: Secondary | ICD-10-CM | POA: Diagnosis not present

## 2023-10-21 DIAGNOSIS — G4700 Insomnia, unspecified: Secondary | ICD-10-CM | POA: Diagnosis not present

## 2023-10-21 DIAGNOSIS — F429 Obsessive-compulsive disorder, unspecified: Secondary | ICD-10-CM | POA: Diagnosis not present

## 2023-10-21 DIAGNOSIS — F411 Generalized anxiety disorder: Secondary | ICD-10-CM | POA: Diagnosis not present

## 2023-11-09 DIAGNOSIS — L700 Acne vulgaris: Secondary | ICD-10-CM | POA: Diagnosis not present

## 2024-03-28 DIAGNOSIS — L700 Acne vulgaris: Secondary | ICD-10-CM | POA: Diagnosis not present

## 2024-03-30 DIAGNOSIS — Z01419 Encounter for gynecological examination (general) (routine) without abnormal findings: Secondary | ICD-10-CM | POA: Diagnosis not present

## 2024-03-30 DIAGNOSIS — Z130 Encounter for screening for diseases of the blood and blood-forming organs and certain disorders involving the immune mechanism: Secondary | ICD-10-CM | POA: Diagnosis not present
